# Patient Record
Sex: Female | Born: 1970 | Race: White | Hispanic: No | Marital: Married | Smoking: Never smoker
Health system: Southern US, Community
[De-identification: ages and names within clinical notes are randomized; demographics above are authoritative.]

## PROBLEM LIST (undated history)

## (undated) DIAGNOSIS — D649 Anemia, unspecified: Secondary | ICD-10-CM

## (undated) DIAGNOSIS — Z9889 Other specified postprocedural states: Secondary | ICD-10-CM

## (undated) DIAGNOSIS — R112 Nausea with vomiting, unspecified: Secondary | ICD-10-CM

## (undated) HISTORY — PX: LAPAROSCOPIC TUBAL LIGATION: SUR803

## (undated) HISTORY — PX: WISDOM TOOTH EXTRACTION: SHX21

## (undated) HISTORY — DX: Nausea with vomiting, unspecified: Z98.890

## (undated) HISTORY — DX: Nausea with vomiting, unspecified: R11.2

## (undated) HISTORY — DX: Anemia, unspecified: D64.9

## (undated) HISTORY — PX: CYSTECTOMY: SUR359

---

## 1999-01-29 ENCOUNTER — Other Ambulatory Visit: Admission: RE | Admit: 1999-01-29 | Discharge: 1999-01-29 | Payer: Self-pay | Admitting: Obstetrics and Gynecology

## 2000-08-02 ENCOUNTER — Other Ambulatory Visit: Admission: RE | Admit: 2000-08-02 | Discharge: 2000-08-02 | Payer: Self-pay | Admitting: Obstetrics and Gynecology

## 2001-12-21 ENCOUNTER — Other Ambulatory Visit: Admission: RE | Admit: 2001-12-21 | Discharge: 2001-12-21 | Payer: Self-pay | Admitting: Obstetrics and Gynecology

## 2003-01-02 ENCOUNTER — Other Ambulatory Visit: Admission: RE | Admit: 2003-01-02 | Discharge: 2003-01-02 | Payer: Self-pay | Admitting: Obstetrics and Gynecology

## 2004-03-05 ENCOUNTER — Other Ambulatory Visit: Admission: RE | Admit: 2004-03-05 | Discharge: 2004-03-05 | Payer: Self-pay | Admitting: Obstetrics and Gynecology

## 2005-06-08 ENCOUNTER — Other Ambulatory Visit: Admission: RE | Admit: 2005-06-08 | Discharge: 2005-06-08 | Payer: Self-pay | Admitting: Obstetrics and Gynecology

## 2006-02-25 ENCOUNTER — Ambulatory Visit (HOSPITAL_COMMUNITY): Admission: RE | Admit: 2006-02-25 | Discharge: 2006-02-25 | Payer: Self-pay | Admitting: Obstetrics and Gynecology

## 2011-11-16 ENCOUNTER — Other Ambulatory Visit: Payer: Self-pay | Admitting: Obstetrics and Gynecology

## 2011-11-16 DIAGNOSIS — R928 Other abnormal and inconclusive findings on diagnostic imaging of breast: Secondary | ICD-10-CM

## 2011-11-24 ENCOUNTER — Ambulatory Visit
Admission: RE | Admit: 2011-11-24 | Discharge: 2011-11-24 | Disposition: A | Discharge status: Home / Self Care | Payer: BC Managed Care – PPO | Source: Ambulatory Visit | Attending: Obstetrics and Gynecology | Admitting: Obstetrics and Gynecology

## 2011-11-24 ENCOUNTER — Other Ambulatory Visit: Payer: Self-pay | Admitting: Obstetrics and Gynecology

## 2011-11-24 DIAGNOSIS — R928 Other abnormal and inconclusive findings on diagnostic imaging of breast: Secondary | ICD-10-CM

## 2011-11-24 DIAGNOSIS — N6310 Unspecified lump in the right breast, unspecified quadrant: Secondary | ICD-10-CM

## 2011-11-30 ENCOUNTER — Ambulatory Visit
Admission: RE | Admit: 2011-11-30 | Discharge: 2011-11-30 | Disposition: A | Discharge status: Home / Self Care | Payer: BC Managed Care – PPO | Source: Ambulatory Visit | Attending: Obstetrics and Gynecology | Admitting: Obstetrics and Gynecology

## 2011-11-30 ENCOUNTER — Other Ambulatory Visit: Payer: Self-pay | Admitting: Obstetrics and Gynecology

## 2011-11-30 DIAGNOSIS — N6310 Unspecified lump in the right breast, unspecified quadrant: Secondary | ICD-10-CM

## 2019-08-31 ENCOUNTER — Ambulatory Visit: Payer: BC Managed Care – PPO | Attending: Internal Medicine

## 2019-08-31 DIAGNOSIS — Z23 Encounter for immunization: Secondary | ICD-10-CM | POA: Insufficient documentation

## 2019-08-31 NOTE — Progress Notes (Signed)
   Covid-19 Vaccination Clinic  Name:  Nancy Tate    MRN: [id number] DOB: 04-11-1971  08/31/2019  [name] was observed post Covid-19 immunization for 15 minutes without incident. She was provided with Vaccine Information Sheet and instruction to access the V-Safe system.   [name] was instructed to call 911 with any severe reactions post vaccine: Marland Kitchen Difficulty breathing  . Swelling of face and throat  . A fast heartbeat  . A bad rash all over body  . Dizziness and weakness   Immunizations Administered    Name Date Dose VIS Date Route   Pfizer COVID-19 Vaccine 08/31/2019  6:41 PM 0.3 mL 06/08/2019 Intramuscular   Manufacturer: [place]   Lot: [id number]   [place]: [id number]

## 2019-10-02 ENCOUNTER — Ambulatory Visit: Payer: BC Managed Care – PPO | Attending: Internal Medicine

## 2019-10-02 DIAGNOSIS — Z23 Encounter for immunization: Secondary | ICD-10-CM

## 2019-10-02 NOTE — Progress Notes (Signed)
2  Covid-19 Vaccination Clinic  Name:  Nancy Tate    MRN: [id number] DOB: [birth year]  10/02/2019  Ms. Cafarella was observed post Covid-19 immunization for 15 minutes without incident. She was provided with Vaccine Information Sheet and instruction to access the V-Safe system.   Ms. Dorothy was instructed to call 911 with any severe reactions post vaccine: Marland Kitchen Difficulty breathing  . Swelling of face and throat  . A fast heartbeat  . A bad rash all over body  . Dizziness and weakness   Immunizations Administered    Name Date Dose VIS Date Route   Pfizer COVID-19 Vaccine 10/02/2019  9:57 AM 0.3 mL 06/08/2019 Intramuscular   Manufacturer: [employer], [employer]   Lot: [id number]   [place]: [id number]

## 2020-07-22 ENCOUNTER — Other Ambulatory Visit: Payer: Self-pay | Admitting: Obstetrics and Gynecology

## 2020-07-22 DIAGNOSIS — R928 Other abnormal and inconclusive findings on diagnostic imaging of breast: Secondary | ICD-10-CM

## 2020-08-04 ENCOUNTER — Other Ambulatory Visit: Payer: Self-pay

## 2020-08-04 ENCOUNTER — Ambulatory Visit: Payer: BC Managed Care – PPO

## 2020-08-04 ENCOUNTER — Ambulatory Visit (AMBULATORY_SURGERY_CENTER): Payer: Self-pay

## 2020-08-04 ENCOUNTER — Ambulatory Visit
Admission: RE | Admit: 2020-08-04 | Discharge: 2020-08-04 | Disposition: A | Discharge status: Home / Self Care | Payer: BC Managed Care – PPO | Source: Ambulatory Visit | Attending: Obstetrics and Gynecology | Admitting: Obstetrics and Gynecology

## 2020-08-04 VITALS — Ht 63.0 in | Wt 134.0 lb

## 2020-08-04 DIAGNOSIS — Z1211 Encounter for screening for malignant neoplasm of colon: Secondary | ICD-10-CM

## 2020-08-04 DIAGNOSIS — R928 Other abnormal and inconclusive findings on diagnostic imaging of breast: Secondary | ICD-10-CM

## 2020-08-04 MED ORDER — SUTAB 1479-225-188 MG PO TABS: 1.0000 | ORAL_TABLET | ORAL | 0 refills | Status: DC

## 2020-08-04 NOTE — Progress Notes (Signed)
No egg or soy allergy known to patient  No issues with past sedation with any surgeries or procedures No intubation problems in the past  No FH of Malignant Hyperthermia No diet pills per patient No home 02 use per patient  No blood thinners per patient  Pt denies issues with constipation  No A fib or A flutter  EMMI video via MyChart  COVID 19 guidelines implemented in PV today with Pt and RN  COVID vaccines completed x 2 + booster= Coupon given to pt in PV today , Code to Pharmacy and  NO PA's for preps discussed with pt in PV today  Due to the COVID-19 pandemic we are asking patients to follow certain guidelines.  Pt aware of COVID protocols and [facility] guidelines

## 2020-08-13 ENCOUNTER — Encounter: Payer: Self-pay | Admitting: Gastroenterology

## 2020-08-18 ENCOUNTER — Encounter: Payer: Self-pay | Admitting: Gastroenterology

## 2020-08-18 ENCOUNTER — Ambulatory Visit (AMBULATORY_SURGERY_CENTER): Payer: 59 | Admitting: Gastroenterology

## 2020-08-18 ENCOUNTER — Other Ambulatory Visit: Payer: Self-pay

## 2020-08-18 VITALS — BP 116/61 | HR 57 | Temp 97.1°F | Resp 21 | Ht 63.0 in | Wt 134.0 lb

## 2020-08-18 DIAGNOSIS — D120 Benign neoplasm of cecum: Secondary | ICD-10-CM

## 2020-08-18 DIAGNOSIS — Z1211 Encounter for screening for malignant neoplasm of colon: Secondary | ICD-10-CM

## 2020-08-18 DIAGNOSIS — D122 Benign neoplasm of ascending colon: Secondary | ICD-10-CM

## 2020-08-18 MED ORDER — SODIUM CHLORIDE 0.9 % IV SOLN: 500.0000 mL | Freq: Once | INTRAVENOUS | Status: DC

## 2020-08-18 NOTE — Progress Notes (Signed)
Report to PACU, RN, vss, BBS= Clear.  

## 2020-08-18 NOTE — Progress Notes (Signed)
Called to room to assist during endoscopic procedure.  Patient ID and intended procedure confirmed with present staff. Received instructions for my participation in the procedure from the performing physician.  

## 2020-08-18 NOTE — Progress Notes (Signed)
Pt's states no medical or surgical changes since previsit or office visit.  VS SB

## 2020-08-18 NOTE — Patient Instructions (Signed)
Information on polyps given to you today.  Await pathology results.  Resume previous diet and medications.  YOU HAD AN ENDOSCOPIC PROCEDURE TODAY AT THE [facility]:   Refer to the procedure report that was given to you for any specific questions about what was found during the examination.  If the procedure report does not answer your questions, please call your gastroenterologist to clarify.  If you requested that your care partner not be given the details of your procedure findings, then the procedure report has been included in a sealed envelope for you to review at your convenience later.  YOU SHOULD EXPECT: Some feelings of bloating in the abdomen. Passage of more gas than usual.  Walking can help get rid of the air that was put into your GI tract during the procedure and reduce the bloating. If you had a lower endoscopy (such as a colonoscopy or flexible sigmoidoscopy) you may notice spotting of blood in your stool or on the toilet paper. If you underwent a bowel prep for your procedure, you may not have a normal bowel movement for a few days.  Please Note:  You might notice some irritation and congestion in your nose or some drainage.  This is from the oxygen used during your procedure.  There is no need for concern and it should clear up in a day or so.  SYMPTOMS TO REPORT IMMEDIATELY:   Following lower endoscopy (colonoscopy or flexible sigmoidoscopy):  Excessive amounts of blood in the stool  Significant tenderness or worsening of abdominal pains  Swelling of the abdomen that is new, acute  Fever of 100F or higher   For urgent or emergent issues, a gastroenterologist can be reached at any hour by calling [phone]. Do not use MyChart messaging for urgent concerns.    DIET:  We do recommend a small meal at first, but then you may proceed to your regular diet.  Drink plenty of fluids but you should avoid alcoholic beverages for 24 hours.  ACTIVITY:  You should  plan to take it easy for the rest of today and you should NOT DRIVE or use heavy machinery until tomorrow (because of the sedation medicines used during the test).    FOLLOW UP: Our staff will call the number listed on your records 48-72 hours following your procedure to check on you and address any questions or concerns that you may have regarding the information given to you following your procedure. If we do not reach you, we will leave a message.  We will attempt to reach you two times.  During this call, we will ask if you have developed any symptoms of COVID 19. If you develop any symptoms (ie: fever, flu-like symptoms, shortness of breath, cough etc.) before then, please call [phone].  If you test positive for Covid 19 in the 2 weeks post procedure, please call and report this information to us.    If any biopsies were taken you will be contacted by phone or by letter within the next 1-3 weeks.  Please call us at [phone] if you have not heard about the biopsies in 3 weeks.    SIGNATURES/CONFIDENTIALITY: You and/or your care partner have signed paperwork which will be entered into your electronic medical record.  These signatures attest to the fact that that the information above on your After Visit Summary has been reviewed and is understood.  Full responsibility of the confidentiality of this discharge information lies with you and/or your care-partner. 

## 2020-08-18 NOTE — Op Note (Signed)
[facility] Patient Name: Nancy Tate Procedure Date: 08/18/2020 11:18 AM MRN: [id number] Endoscopist: Thornton Park MD, MD Age: 50 Referring MD:  Date of Birth: 1970-11-23 Gender: Female Account #: [id number] Procedure:                Colonoscopy Indications:              Screening for colorectal malignant neoplasm, This                            is the patient's first colonoscopy                           No known family history of colon cancer or polyps Medicines:                Monitored Anesthesia Care Procedure:                Pre-Anesthesia Assessment:                           - Prior to the procedure, a History and Physical                            was performed, and patient medications and                            allergies were reviewed. The patient's tolerance of                            previous anesthesia was also reviewed. The risks                            and benefits of the procedure and the sedation                            options and risks were discussed with the patient.                            All questions were answered, and informed consent                            was obtained. Prior Anticoagulants: The patient has                            taken no previous anticoagulant or antiplatelet                            agents. ASA Grade Assessment: I - A normal, healthy                            patient. After reviewing the risks and benefits,                            the patient was deemed in satisfactory condition to  undergo the procedure.                           After obtaining informed consent, the colonoscope                            was passed under direct vision. Throughout the                            procedure, the patient's blood pressure, pulse, and                            oxygen saturations were monitored continuously. The                            Olympus PFC-[id number] (#[id number])  Colonoscope was                            introduced through the anus and advanced to the 3                            cm into the ileum. A second forward view of the                            right colon was performed. The colonoscopy was                            performed without difficulty. The patient tolerated                            the procedure well. The quality of the bowel                            preparation was excellent. The terminal ileum,                            ileocecal valve, appendiceal orifice, and rectum                            were photographed. Scope In: 11:29:45 AM Scope Out: 11:42:33 AM Scope Withdrawal Time: 0 hours 9 minutes 4 seconds  Total Procedure Duration: 0 hours 12 minutes 48 seconds  Findings:                 The perianal and digital rectal examinations were                            normal.                           Two flat polyps were found in the ascending colon                            and cecum. The polyps were less than 1 mm in size.  These polyps were removed with a cold snare.                            Resection and retrieval were complete. Estimated                            blood loss was minimal.                           The exam was otherwise without abnormality on                            direct and retroflexion views. Complications:            No immediate complications. Estimated blood loss:                            Minimal. Estimated Blood Loss:     Estimated blood loss was minimal. Impression:               - Two less than 1 mm polyps in the ascending colon                            and in the cecum, removed with a cold snare.                            Resected and retrieved.                           - The examination was otherwise normal on direct                            and retroflexion views. Recommendation:           - Patient has a contact number available for                             emergencies. The signs and symptoms of potential                            delayed complications were discussed with the                            patient. Return to normal activities tomorrow.                            Written discharge instructions were provided to the                            patient.                           - Resume previous diet.                           - Continue present medications.                           -  Await pathology results.                           - Repeat colonoscopy date to be determined after                            pending pathology results are reviewed for                            surveillance.                           - Emerging evidence supports eating a diet of                            fruits, vegetables, grains, calcium, and yogurt                            while reducing red meat and alcohol may reduce the                            risk of colon cancer.                           - Thank you for allowing me to be involved in your                            colon cancer prevention. Thornton Park MD, MD 08/18/2020 11:48:31 AM This report has been signed electronically.

## 2020-08-20 ENCOUNTER — Telehealth: Payer: Self-pay | Admitting: *Deleted

## 2020-08-20 NOTE — Telephone Encounter (Signed)
  Follow up Call-  Call back number 08/18/2020 08/18/2020  Post procedure Call Back phone  # [phone]  Permission to leave phone message Yes Yes  Some recent data might be hidden     Patient questions:  Do you have a fever, pain , or abdominal swelling? No. Pain Score  0 *  Have you tolerated food without any problems? Yes.    Have you been able to return to your normal activities? Yes.    Do you have any questions about your discharge instructions: Diet   No. Medications  No. Follow up visit  No.  Do you have questions or concerns about your Care? No.  Actions: * If pain score is 4 or above: No action needed, pain <4.  1. Have you developed a fever since your procedure? no  2.   Have you had an respiratory symptoms (SOB or cough) since your procedure? no  3.   Have you tested positive for COVID 19 since your procedure no  4.   Have you had any family members/close contacts diagnosed with the COVID 19 since your procedure?  no   If yes to any of these questions please route to Joylene John, RN and Joella Prince, RN

## 2020-08-25 ENCOUNTER — Encounter: Payer: Self-pay | Admitting: Gastroenterology

## 2021-06-10 ENCOUNTER — Other Ambulatory Visit: Payer: Self-pay | Admitting: Obstetrics and Gynecology

## 2021-06-10 DIAGNOSIS — Z1231 Encounter for screening mammogram for malignant neoplasm of breast: Secondary | ICD-10-CM

## 2021-07-17 ENCOUNTER — Ambulatory Visit: Payer: 59

## 2021-07-22 ENCOUNTER — Ambulatory Visit
Admission: RE | Admit: 2021-07-22 | Discharge: 2021-07-22 | Disposition: A | Discharge status: Home / Self Care | Payer: 59 | Source: Ambulatory Visit | Attending: Obstetrics and Gynecology | Admitting: Obstetrics and Gynecology

## 2021-07-22 ENCOUNTER — Encounter: Payer: Self-pay | Admitting: Radiology

## 2021-07-22 DIAGNOSIS — Z1231 Encounter for screening mammogram for malignant neoplasm of breast: Secondary | ICD-10-CM

## 2021-09-21 ENCOUNTER — Ambulatory Visit: Payer: 59 | Admitting: Physician Assistant

## 2021-09-21 ENCOUNTER — Encounter: Payer: Self-pay | Admitting: Physician Assistant

## 2021-09-21 ENCOUNTER — Other Ambulatory Visit (INDEPENDENT_AMBULATORY_CARE_PROVIDER_SITE_OTHER): Payer: 59

## 2021-09-21 VITALS — BP 108/70 | HR 70 | Ht 63.0 in | Wt 141.0 lb

## 2021-09-21 DIAGNOSIS — D509 Iron deficiency anemia, unspecified: Secondary | ICD-10-CM

## 2021-09-21 DIAGNOSIS — R12 Heartburn: Secondary | ICD-10-CM

## 2021-09-21 DIAGNOSIS — R1013 Epigastric pain: Secondary | ICD-10-CM | POA: Diagnosis not present

## 2021-09-21 LAB — CBC WITH DIFFERENTIAL/PLATELET
Basophils Absolute: 0.1 10*3/uL (ref 0.0–0.1)
Basophils Relative: 1.1 % (ref 0.0–3.0)
Eosinophils Absolute: 0.1 10*3/uL (ref 0.0–0.7)
Eosinophils Relative: 1.4 % (ref 0.0–5.0)
HCT: 40.6 % (ref 36.0–46.0)
Hemoglobin: 13.5 g/dL (ref 12.0–15.0)
Lymphocytes Relative: 21.2 % (ref 12.0–46.0)
Lymphs Abs: 1.7 10*3/uL (ref 0.7–4.0)
MCHC: 33.3 g/dL (ref 30.0–36.0)
MCV: 88.3 fl (ref 78.0–100.0)
Monocytes Absolute: 0.6 10*3/uL (ref 0.1–1.0)
Monocytes Relative: 7.5 % (ref 3.0–12.0)
Neutro Abs: 5.6 10*3/uL (ref 1.4–7.7)
Neutrophils Relative %: 68.8 % (ref 43.0–77.0)
Platelets: 275 10*3/uL (ref 150.0–400.0)
RBC: 4.59 Mil/uL (ref 3.87–5.11)
RDW: 17.3 % — ABNORMAL HIGH (ref 11.5–15.5)
WBC: 8.1 10*3/uL (ref 4.0–10.5)

## 2021-09-21 LAB — IBC PANEL
Iron: 71 ug/dL (ref 42–145)
Saturation Ratios: 24.9 % (ref 20.0–50.0)
TIBC: 285.6 ug/dL (ref 250.0–450.0)
Transferrin: 204 mg/dL — ABNORMAL LOW (ref 212.0–360.0)

## 2021-09-21 MED ORDER — OMEPRAZOLE 40 MG PO CPDR: 40.0000 mg | DELAYED_RELEASE_CAPSULE | Freq: Two times a day (BID) | ORAL | 2 refills | Status: DC

## 2021-09-21 MED ORDER — OMEPRAZOLE 20 MG PO CPDR: 20.0000 mg | DELAYED_RELEASE_CAPSULE | Freq: Two times a day (BID) | ORAL | 2 refills | Status: DC

## 2021-09-21 NOTE — Patient Instructions (Addendum)
If you are age 53 or older, your body mass index should be between 23-30. Your Body mass index is 24.98 kg/m?Marland Kitchen If this is out of the aforementioned range listed, please consider follow up with your Primary Care Provider. ? ?If you are age 13 or younger, your body mass index should be between 19-25. Your Body mass index is 24.98 kg/m?Marland Kitchen If this is out of the aformentioned range listed, please consider follow up with your Primary Care Provider.  ? ?________________________________________________________ ? ?The [facility] providers would like to encourage you to use [facility] to communicate with providers for non-urgent requests or questions.  Due to long hold times on the telephone, sending your provider a message by [facility] may be a faster and more efficient way to get a response.  Please allow 48 business hours for a response.  Please remember that this is for non-urgent requests.  ?_______________________________________________________ ? ?Your provider has requested that you go to the basement level for lab work before leaving today. Press "B" on the elevator. The lab is located at the first door on the left as you exit the elevator. ? ?We have sent the following medications to your pharmacy for you to pick up at your convenience: Omeprazole '40mg'$  - increase to twice a day ? ?

## 2021-09-21 NOTE — Progress Notes (Signed)
? ?Chief Complaint: Iron deficiency anemia ? ?HPI: ?   Nancy Tate is a 51 year old female, known to Dr. Tarri Glenn, with a past medical history as listed below, who was referred to me by Tisovec, Fransico Him, MD for a complaint of iron deficiency anemia. ?   08/18/2020 colonoscopy with 2 less than 1 mm polyps in the ascending colon and cecum.  Pathology showed tubular adenomas and repeat recommended in 7 years. ?   Today patient reports that her hemoglobin was initially 9.6 in December and she had a ferritin low at 6.  She was started on oral iron tabs and Prilosec as she had also started with some epigastric pain.  Her hemoglobin was then rechecked in January and was up to 11 at her OB/GYN's office.  Her PCP feared that her ferritin was so low that she would need iron infusions and she had one in February.  She is currently not on any oral iron supplementation.  Tells me she is still on Omeprazole 20 mg daily and has constant epigastric pain made worse when she eats as well as some heartburn but no reflux symptoms.  Denies seeing black or tarry stool.  Prior to December patient was on Meloxicam and other NSAIDs which were discontinued then for fear of upper GI ulcer/bleed. ?   Denies fever, chills or weight loss. ? ?Past Medical History:  ?Diagnosis Date  ? Anemia   ? not on meds at this time (08/04/2020)  ? PONV (postoperative nausea and vomiting)   ? ? ?Past Surgical History:  ?Procedure Laterality Date  ? BREAST BIOPSY Right   ? 2013 (-)  ? CYSTECTOMY    ? uterine cyst  ? LAPAROSCOPIC TUBAL LIGATION    ? WISDOM TOOTH EXTRACTION    ? ? ?Current Outpatient Medications  ?Medication Sig Dispense Refill  ? omeprazole (PRILOSEC) 20 MG capsule 1 capsule 30 minutes before morning meal    ? ?No current facility-administered medications for this visit.  ? ? ?Allergies as of 09/21/2021 - Review Complete 09/21/2021  ?Allergen Reaction Noted  ? Codeine  11/30/2011  ? Neosporin [neomycin-bacitracin zn-polymyx]  11/30/2011   ? ? ?Family History  ?Problem Relation Age of Onset  ? Colon polyps Mother 6  ? Colon cancer Neg Hx   ? Esophageal cancer Neg Hx   ? Rectal cancer Neg Hx   ? Stomach cancer Neg Hx   ? ? ?Social History  ? ?Socioeconomic History  ? Marital status: Married  ?  Spouse name: Not on file  ? Number of children: 2  ? Years of education: Not on file  ? Highest education level: Not on file  ?Occupational History  ? Not on file  ?Tobacco Use  ? Smoking status: Never  ? Smokeless tobacco: Never  ?Vaping Use  ? Vaping Use: Never used  ?Substance and Sexual Activity  ? Alcohol use: Never  ? Drug use: Never  ? Sexual activity: Not on file  ?Other Topics Concern  ? Not on file  ?Social History Narrative  ? Not on file  ? ?Social Determinants of Health  ? ?Financial Resource Strain: Not on file  ?Food Insecurity: Not on file  ?Transportation Needs: Not on file  ?Physical Activity: Not on file  ?Stress: Not on file  ?Social Connections: Not on file  ?Intimate Partner Violence: Not on file  ? ? ?Review of Systems:    ?Constitutional: No weight loss, fever or chills ?Cardiovascular: No chest pain ?Respiratory: No SOB  ?  Gastrointestinal: See HPI and otherwise negative ? ? Physical Exam:  ?Vital signs: ?BP 108/70   Pulse 70   Ht '5\' 3"'$  (1.6 m)   Wt 141 lb (64 kg)   SpO2 99%   BMI 24.98 kg/m?   ? ?Constitutional:   Pleasant Caucasian female appears to be in NAD, Well developed, Well nourished, alert and cooperative ?Respiratory: Respirations even and unlabored. Lungs clear to auscultation bilaterally.   No wheezes, crackles, or rhonchi.  ?Cardiovascular: Normal S1, S2. No MRG. Regular rate and rhythm. No peripheral edema, cyanosis or pallor.  ?Gastrointestinal:  Soft, nondistended, moderate epigastric TTP with involuntary guarding,. Normal bowel sounds. No appreciable masses or hepatomegaly. ?Rectal:  Not performed.  ?Psychiatric:Demonstrates good judgement and reason without abnormal affect or behaviors. ? ?See HPI for recent  labs. ? ?Assessment: ?1.  IDA: Recent colonoscopy 08/18/2020 for screening with 2 small polyps found to be tubular adenomas, in December patient had a ferritin low at 6 and hemoglobin 9.6 at her PCPs office and started oral iron supplementation, in January hemoglobin 11, underwent iron infusions in February, has continued with epigastric pain regardless of Omeprazole 20 mg daily, previous history of NSAID use; consider upper GI bleed most likely from possible PUD/gastritis ?2.  Epigastric pain and heartburn: With above ? ?Plan: ?1.  Increased Omeprazole to 40 mg twice daily, 30-60 minutes before breakfast and dinner prescribed #60 with 2 refills ?2.  Told patient to continue to avoid NSAIDs ?3.  Recheck CBC and iron studies today ?4.  Scheduled patient for diagnostic EGD in the [facility] with Dr. Tarri Glenn.  Did provide the patient a detailed list of risks for the procedure and she agrees to proceed. Patient is appropriate for endoscopic procedure(s) in the ambulatory ([facility]) setting.  ?5.  Patient follow in clinic per recommendations from Dr. Tarri Glenn after time of procedure. ? ?Ellouise Newer, PA-C ?[facility] ?09/21/2021, 1:59 PM ? ?Cc: Tisovec, Fransico Him, MD  ?

## 2021-09-21 NOTE — Progress Notes (Signed)
Reviewed and agree with management plans. Please place on cancellation list in the event that an earlier procedure becomes available.  ? ?Alvira Hecht L. Tarri Glenn, MD, MPH  ?

## 2021-09-22 LAB — FERRITIN: Ferritin: 184.6 ng/mL (ref 10.0–291.0)

## 2021-09-22 NOTE — Progress Notes (Signed)
Pt has been successfully added to wait list per Dr. Tarri Glenn request. ?

## 2021-09-23 ENCOUNTER — Other Ambulatory Visit: Payer: Self-pay

## 2021-09-23 DIAGNOSIS — D509 Iron deficiency anemia, unspecified: Secondary | ICD-10-CM

## 2021-10-26 ENCOUNTER — Encounter: Payer: 59 | Admitting: Gastroenterology

## 2021-11-16 ENCOUNTER — Ambulatory Visit (AMBULATORY_SURGERY_CENTER): Payer: 59 | Admitting: Gastroenterology

## 2021-11-16 ENCOUNTER — Encounter: Payer: Self-pay | Admitting: Gastroenterology

## 2021-11-16 VITALS — BP 112/65 | HR 63 | Temp 98.9°F | Resp 14 | Ht 63.0 in | Wt 141.0 lb

## 2021-11-16 DIAGNOSIS — D509 Iron deficiency anemia, unspecified: Secondary | ICD-10-CM | POA: Diagnosis present

## 2021-11-16 DIAGNOSIS — K259 Gastric ulcer, unspecified as acute or chronic, without hemorrhage or perforation: Secondary | ICD-10-CM

## 2021-11-16 DIAGNOSIS — R1013 Epigastric pain: Secondary | ICD-10-CM

## 2021-11-16 DIAGNOSIS — K3189 Other diseases of stomach and duodenum: Secondary | ICD-10-CM | POA: Diagnosis not present

## 2021-11-16 MED ORDER — SODIUM CHLORIDE 0.9 % IV SOLN: 500.0000 mL | Freq: Once | INTRAVENOUS | Status: DC

## 2021-11-16 NOTE — Progress Notes (Signed)
Called to room to assist during endoscopic procedure.  Patient ID and intended procedure confirmed with present staff. Received instructions for my participation in the procedure from the performing physician.  

## 2021-11-16 NOTE — Progress Notes (Signed)
Pt's states no medical or surgical changes since previsit or office visit. VS assessed by C.W 

## 2021-11-16 NOTE — Progress Notes (Signed)
   Referring Provider: Haywood Pao, MD Primary Care Physician:  Haywood Pao, MD  Indication for Procedure: Iron deficiency anemia, epigastric pain, heartburn   IMPRESSION:  Need for colon cancer screening Appropriate candidate for monitored anesthesia care  PLAN: Colonoscopy in the [facility] today   HPI: Nancy Tate is a 51 y.o. female presents for endoscopic evaluation of iron deficiency anemia, epigastric pain, and heartburn.  Recent colonoscopy 08/18/2020 for screening with 2 small polyps found to be tubular adenomas.  In December patient had a ferritin low at 6 and hemoglobin 9.6 at her PCPs office and started oral iron supplementation, in January hemoglobin 11, underwent iron infusions in February, has continued with epigastric pain regardless of Omeprazole 20 mg daily, previous history of NSAID use.    Past Medical History:  Diagnosis Date   Anemia    not on meds at this time (08/04/2020)   PONV (postoperative nausea and vomiting)     Past Surgical History:  Procedure Laterality Date   BREAST BIOPSY Right    2013 (-)   CYSTECTOMY     uterine cyst   LAPAROSCOPIC TUBAL LIGATION     WISDOM TOOTH EXTRACTION      Current Outpatient Medications  Medication Sig Dispense Refill   omeprazole (PRILOSEC) 40 MG capsule Take 1 capsule (40 mg total) by mouth in the morning and at bedtime. 60 capsule 2   Current Facility-Administered Medications  Medication Dose Route Frequency Provider Last Rate Last Admin   0.9 %  sodium chloride infusion  500 mL Intravenous Once Thornton Park, MD        Allergies as of 11/16/2021 - Review Complete 11/16/2021  Allergen Reaction Noted   Codeine  11/30/2011   Neosporin [neomycin-bacitracin zn-polymyx]  11/30/2011    Family History  Problem Relation Age of Onset   Colon polyps Mother 35   Colon cancer Neg Hx    Esophageal cancer Neg Hx    Rectal cancer Neg Hx    Stomach cancer Neg Hx      Physical Exam: General:    Alert,  well-nourished, pleasant and cooperative in NAD Head:  Normocephalic and atraumatic. Eyes:  Sclera clear, no icterus.   Conjunctiva pink. Mouth:  No deformity or lesions.   Neck:  Supple; no masses or thyromegaly. Lungs:  Clear throughout to auscultation.   No wheezes. Heart:  Regular rate and rhythm; no murmurs. Abdomen:  Soft, non-tender, nondistended, normal bowel sounds, no rebound or guarding.  Msk:  Symmetrical. No boney deformities LAD: No inguinal or umbilical LAD Extremities:  No clubbing or edema. Neurologic:  Alert and  oriented x4;  grossly nonfocal Skin:  No obvious rash or bruise. Psych:  Alert and cooperative. Normal mood and affect.     Studies/Results: No results found.    Fawna Cranmer L. Tarri Glenn, MD, MPH 11/16/2021, 3:39 PM

## 2021-11-16 NOTE — Patient Instructions (Signed)
Continue anti-acid medications as discussed with Dr. Tarri Glenn Await pathology results No NSAID's (ie. Asprin, Naproxen, Ibuprofen, or other non-steroidal anti-inflammatory medications)   YOU HAD AN ENDOSCOPIC PROCEDURE TODAY AT [facility]:   Refer to the procedure report that was given to you for any specific questions about what was found during the examination.  If the procedure report does not answer your questions, please call your gastroenterologist to clarify.  If you requested that your care partner not be given the details of your procedure findings, then the procedure report has been included in a sealed envelope for you to review at your convenience later.  YOU SHOULD EXPECT: Some feelings of bloating in the abdomen. Passage of more gas than usual.  Walking can help get rid of the air that was put into your GI tract during the procedure and reduce the bloating. If you had a lower endoscopy (such as a colonoscopy or flexible sigmoidoscopy) you may notice spotting of blood in your stool or on the toilet paper. If you underwent a bowel prep for your procedure, you may not have a normal bowel movement for a few days.  Please Note:  You might notice some irritation and congestion in your nose or some drainage.  This is from the oxygen used during your procedure.  There is no need for concern and it should clear up in a day or so.  SYMPTOMS TO REPORT IMMEDIATELY:  Following upper endoscopy (EGD)  Vomiting of blood or coffee ground material  New chest pain or pain under the shoulder blades  Painful or persistently difficult swallowing  New shortness of breath  Fever of 100F or higher  Black, tarry-looking stools  For urgent or emergent issues, a gastroenterologist can be reached at any hour by calling [phone]. Do not use MyChart messaging for urgent concerns.    DIET:  We do recommend a small meal at first, but then you may proceed to your regular diet.  Drink  plenty of fluids but you should avoid alcoholic beverages for 24 hours.  ACTIVITY:  You should plan to take it easy for the rest of today and you should NOT DRIVE or use heavy machinery until tomorrow (because of the sedation medicines used during the test).    FOLLOW UP: Our staff will call the number listed on your records 48-72 hours following your procedure to check on you and address any questions or concerns that you may have regarding the information given to you following your procedure. If we do not reach you, we will leave a message.  We will attempt to reach you two times.  During this call, we will ask if you have developed any symptoms of COVID 19. If you develop any symptoms (ie: fever, flu-like symptoms, shortness of breath, cough etc.) before then, please call [phone].  If you test positive for Covid 19 in the 2 weeks post procedure, please call and report this information to [place].    If any biopsies were taken you will be contacted by phone or by letter within the next 1-3 weeks.  Please call us at [phone] if you have not heard about the biopsies in 3 weeks.    SIGNATURES/CONFIDENTIALITY: You and/or your care partner have signed paperwork which will be entered into your electronic medical record.  These signatures attest to the fact that that the information above on your After Visit Summary has been reviewed and is understood.  Full responsibility of the confidentiality of  this discharge information lies with you and/or your care-partner.

## 2021-11-16 NOTE — Op Note (Signed)
[place] Patient Name: Nancy Tate Procedure Date: 11/16/2021 3:38 PM MRN: [id number] Endoscopist: Thornton Park MD, MD Age: 51 Referring MD:  Date of Birth: Dec 05, 1970 Gender: Female Account #: [id number] Procedure:                Upper GI endoscopy Indications:              Abdominal pain, Unexplained iron deficiency anemia Medicines:                Monitored Anesthesia Care Procedure:                Pre-Anesthesia Assessment:                           - Prior to the procedure, a History and Physical                            was performed, and patient medications and                            allergies were reviewed. The patient's tolerance of                            previous anesthesia was also reviewed. The risks                            and benefits of the procedure and the sedation                            options and risks were discussed with the patient.                            All questions were answered, and informed consent                            was obtained. Prior Anticoagulants: The patient has                            taken no previous anticoagulant or antiplatelet                            agents. ASA Grade Assessment: II - A patient with                            mild systemic disease. After reviewing the risks                            and benefits, the patient was deemed in                            satisfactory condition to undergo the procedure.                           After obtaining informed consent, the endoscope was  passed under direct vision. Throughout the                            procedure, the patient's blood pressure, pulse, and                            oxygen saturations were monitored continuously. The                            Endoscope was introduced through the mouth, and                            advanced to the third part of duodenum. The upper                            GI  endoscopy was accomplished without difficulty.                            The patient tolerated the procedure well. Scope In: Scope Out: Findings:                 The esophagus was normal.                           Diffuse severe superficial mucosal changes                            characterized by ulceration were found throughout                            the gastric body. Biopsies were taken from the                            areas of ulceration as well as from the more normal                            appearing antrum, body, and fundus with a cold                            forceps for histology. Estimated blood loss was                            minimal.                           The examined duodenum was normal. Complications:            No immediate complications. Estimated Blood Loss:     Estimated blood loss was minimal. Impression:               - Normal esophagus.                           - Ulcerated mucosa in the gastric body. Biopsied.                           -  Normal examined duodenum. Recommendation:           - Patient has a contact number available for                            emergencies. The signs and symptoms of potential                            delayed complications were discussed with the                            patient. Return to normal activities tomorrow.                            Written discharge instructions were provided to the                            patient.                           - Resume previous diet.                           - Continue present medications including                            pantoprazole 40 mg BID.                           - Await pathology results.                           - No aspirin, ibuprofen, naproxen, or other                            non-steroidal anti-inflammatory drugs. Thornton Park MD, MD 11/16/2021 4:03:35 PM This report has been signed electronically.

## 2021-11-17 ENCOUNTER — Telehealth: Payer: Self-pay

## 2021-11-17 NOTE — Telephone Encounter (Signed)
  Follow up Call-     11/16/2021    3:23 PM 08/18/2020   10:52 AM 08/18/2020   10:47 AM  Call back number  Post procedure Call Back phone  # [phone]  Permission to leave phone message Yes Yes Yes     Patient questions:  Do you have a fever, pain , or abdominal swelling? No. Pain Score  0 *  Have you tolerated food without any problems? Yes.    Have you been able to return to your normal activities? Yes.    Do you have any questions about your discharge instructions: Diet   No. Medications  No. Follow up visit  No.  Do you have questions or concerns about your Care? No.  Actions: * If pain score is 4 or above: No action needed, pain <4.

## 2021-11-17 NOTE — Telephone Encounter (Signed)
No answer, left message to call back later today, B.Grisell Bissette RN. 

## 2021-12-08 ENCOUNTER — Telehealth: Payer: Self-pay | Admitting: *Deleted

## 2021-12-08 NOTE — Telephone Encounter (Signed)
Contacted patient with Dr Payton Emerald recommendations.  F/u EGD scheduled for 02/01/22 @ 1:30 .  PV instructions sent through [facility]. Patient was advised to call office if she has any changes to health or medications before this follow up.

## 2021-12-18 ENCOUNTER — Other Ambulatory Visit: Payer: Self-pay | Admitting: Physician Assistant

## 2021-12-24 ENCOUNTER — Telehealth: Payer: Self-pay

## 2021-12-24 ENCOUNTER — Other Ambulatory Visit (INDEPENDENT_AMBULATORY_CARE_PROVIDER_SITE_OTHER): Payer: 59

## 2021-12-24 DIAGNOSIS — D509 Iron deficiency anemia, unspecified: Secondary | ICD-10-CM | POA: Diagnosis not present

## 2021-12-24 LAB — CBC WITH DIFFERENTIAL/PLATELET
Basophils Absolute: 0.1 10*3/uL (ref 0.0–0.1)
Basophils Relative: 1.2 % (ref 0.0–3.0)
Eosinophils Absolute: 0.1 10*3/uL (ref 0.0–0.7)
Eosinophils Relative: 1.4 % (ref 0.0–5.0)
HCT: 42 % (ref 36.0–46.0)
Hemoglobin: 13.9 g/dL (ref 12.0–15.0)
Lymphocytes Relative: 20.3 % (ref 12.0–46.0)
Lymphs Abs: 1.7 10*3/uL (ref 0.7–4.0)
MCHC: 33.2 g/dL (ref 30.0–36.0)
MCV: 91.1 fl (ref 78.0–100.0)
Monocytes Absolute: 0.7 10*3/uL (ref 0.1–1.0)
Monocytes Relative: 8.6 % (ref 3.0–12.0)
Neutro Abs: 5.6 10*3/uL (ref 1.4–7.7)
Neutrophils Relative %: 68.5 % (ref 43.0–77.0)
Platelets: 310 10*3/uL (ref 150.0–400.0)
RBC: 4.61 Mil/uL (ref 3.87–5.11)
RDW: 13.5 % (ref 11.5–15.5)
WBC: 8.2 10*3/uL (ref 4.0–10.5)

## 2021-12-24 LAB — IBC + FERRITIN
Ferritin: 144.6 ng/mL (ref 10.0–291.0)
Iron: 115 ug/dL (ref 42–145)
Saturation Ratios: 35.4 % (ref 20.0–50.0)
TIBC: 324.8 ug/dL (ref 250.0–450.0)
Transferrin: 232 mg/dL (ref 212.0–360.0)

## 2021-12-24 NOTE — Telephone Encounter (Signed)
-----   Message from Marice Potter, RN sent at 09/23/2021  4:06 PM EDT ----- Regarding: labs Pt needs repeat CBC and Iron panel. Orders are in epic

## 2021-12-24 NOTE — Telephone Encounter (Signed)
Spoke with patient to remind her that she is due for repeat labs at this time. No appointment is necessary. Patient is aware that she can stop by the lab in the basement at her convenience between 7:30 AM - 5 PM, Monday through Friday. Patient verbalized understanding and had no concerns at the end of the call.   

## 2022-01-14 ENCOUNTER — Telehealth: Payer: Self-pay | Admitting: Gastroenterology

## 2022-01-14 NOTE — Telephone Encounter (Signed)
Inbound call from patient requesting to speak with someone in regards to upcoming EDG she has scheduled. Patient states she wants to know why she has to have another procedure. Advised patient someone will give her a call back.  Thank you

## 2022-01-14 NOTE — Telephone Encounter (Signed)
Patient is currently scheduled for an EGD on 02/01/22. She would like to know if the EGD is absolutely necessary (given expense) since her iron levels have improved from recent blood work collected on 12/24/21. If so, she is agreeable with moving forward with the procedure, however if it is something that she can wait on & just recheck labs again in a month then she would like to know if that is an option. Will route to MD for further recommendations.

## 2022-01-15 NOTE — Telephone Encounter (Signed)
Spoke with patient regarding MD recommendations. Pt plans to proceed with EGD.

## 2022-02-01 ENCOUNTER — Telehealth: Payer: Self-pay

## 2022-02-01 ENCOUNTER — Encounter: Payer: Self-pay | Admitting: Gastroenterology

## 2022-02-01 ENCOUNTER — Ambulatory Visit (AMBULATORY_SURGERY_CENTER): Payer: 59 | Admitting: Gastroenterology

## 2022-02-01 VITALS — BP 113/65 | HR 63 | Temp 97.0°F | Resp 14 | Ht 63.0 in | Wt 141.0 lb

## 2022-02-01 DIAGNOSIS — D509 Iron deficiency anemia, unspecified: Secondary | ICD-10-CM | POA: Diagnosis not present

## 2022-02-01 DIAGNOSIS — K2100 Gastro-esophageal reflux disease with esophagitis, without bleeding: Secondary | ICD-10-CM | POA: Diagnosis not present

## 2022-02-01 DIAGNOSIS — R1013 Epigastric pain: Secondary | ICD-10-CM

## 2022-02-01 DIAGNOSIS — K3189 Other diseases of stomach and duodenum: Secondary | ICD-10-CM | POA: Diagnosis not present

## 2022-02-01 DIAGNOSIS — K219 Gastro-esophageal reflux disease without esophagitis: Secondary | ICD-10-CM

## 2022-02-01 MED ORDER — SODIUM CHLORIDE 0.9 % IV SOLN: 500.0000 mL | Freq: Once | INTRAVENOUS | Status: DC

## 2022-02-01 NOTE — Telephone Encounter (Signed)
Follow up scheduled for 03/03/22 at 8:30 am with Anderson Malta, [place]. Pt notified via mychart.

## 2022-02-01 NOTE — Progress Notes (Signed)
   Referring Provider: Haywood Pao, MD Primary Care Physician:  Haywood Pao, MD  Indication for Procedure:  Assess mucosal healing of gastric ulcers   IMPRESSION:  Gastric ulceration on EGD 11/16/21 Appropriate candidate for monitored anesthesia care  PLAN: EGD to assess for mucosal healing in the [facility] today   HPI: Nancy Tate is a 51 y.o. female presents for EGD to follow-up on gastric ulcerations.  Endoscopic evaluation for iron deficiency anemia 11/16/2021 revealed diffusely ulcerated gastric mucosa.  Biopsies showed reactive gastropathy with erosion.  Biopsies were negative for H. pylori.  Repeat EGD recommended in 8 to 10 weeks to assess mucosal healing.  Labs 12/24/21 showed normal iron, ferritin, and CBC.      Past Medical History:  Diagnosis Date   Anemia    not on meds at this time (08/04/2020)   PONV (postoperative nausea and vomiting)     Past Surgical History:  Procedure Laterality Date   BREAST BIOPSY Right    2013 (-)   CYSTECTOMY     uterine cyst   LAPAROSCOPIC TUBAL LIGATION     WISDOM TOOTH EXTRACTION      Current Outpatient Medications  Medication Sig Dispense Refill   omeprazole (PRILOSEC) 40 MG capsule TAKE ONE CAPSULE BY MOUTH EVERY MORNING AND 1 CAPSULE AT BEDTIME 180 capsule 0   Current Facility-Administered Medications  Medication Dose Route Frequency Provider Last Rate Last Admin   0.9 %  sodium chloride infusion  500 mL Intravenous Once Thornton Park, MD        Allergies as of 02/01/2022 - Review Complete 02/01/2022  Allergen Reaction Noted   Codeine  11/30/2011   Bacitra-neomycin-polymyxin-hc Rash 02/01/2022   Neomycin-bacitracin zn-polymyx Rash 11/30/2011    Family History  Problem Relation Age of Onset   Colon polyps Mother 28   Colon cancer Neg Hx    Esophageal cancer Neg Hx    Rectal cancer Neg Hx    Stomach cancer Neg Hx      Physical Exam: General:   Alert,  well-nourished, pleasant and cooperative in  NAD Head:  Normocephalic and atraumatic. Eyes:  Sclera clear, no icterus.   Conjunctiva pink. Mouth:  No deformity or lesions.   Neck:  Supple; no masses or thyromegaly. Lungs:  Clear throughout to auscultation.   No wheezes. Heart:  Regular rate and rhythm; no murmurs. Abdomen:  Soft, non-tender, nondistended, normal bowel sounds, no rebound or guarding.  Msk:  Symmetrical. No boney deformities LAD: No inguinal or umbilical LAD Extremities:  No clubbing or edema. Neurologic:  Alert and  oriented x4;  grossly nonfocal Skin:  No obvious rash or bruise. Psych:  Alert and cooperative. Normal mood and affect.     Studies/Results: No results found.    Ashlye Oviedo L. Tarri Glenn, MD, MPH 02/01/2022, 1:24 PM

## 2022-02-01 NOTE — Telephone Encounter (Signed)
-----   Message from Thornton Park, MD sent at 02/01/2022  1:46 PM EDT ----- Please arrange office follow-up with any APP.  Thanks.  KLB

## 2022-02-01 NOTE — Progress Notes (Signed)
Called to room to assist during endoscopic procedure.  Patient ID and intended procedure confirmed with present staff. Received instructions for my participation in the procedure from the performing physician.  

## 2022-02-01 NOTE — Progress Notes (Signed)
Pt in recovery with monitors in place, VSS. Report given to receiving RN. Bite guard was placed with pt awake to ensure comfort. No dental or soft tissue damage noted. 

## 2022-02-01 NOTE — Progress Notes (Signed)
Pt's states no medical or surgical changes since previsit or office visit. 

## 2022-02-01 NOTE — Patient Instructions (Signed)
Await pathology results from biopsies taken today.  Resume previous diet and continue present medications. Follow-up appointment in office with Dr. Tarri Glenn - her nurse will arrange this  No NSAID's (aspirin, naproxen, ibuprofen, or any other non-steroidal anti-inflammatory medications)   YOU HAD AN ENDOSCOPIC PROCEDURE TODAY AT [facility]:   Refer to the procedure report that was given to you for any specific questions about what was found during the examination.  If the procedure report does not answer your questions, please call your gastroenterologist to clarify.  If you requested that your care partner not be given the details of your procedure findings, then the procedure report has been included in a sealed envelope for you to review at your convenience later.  YOU SHOULD EXPECT: Some feelings of bloating in the abdomen. Passage of more gas than usual.  Walking can help get rid of the air that was put into your GI tract during the procedure and reduce the bloating. If you had a lower endoscopy (such as a colonoscopy or flexible sigmoidoscopy) you may notice spotting of blood in your stool or on the toilet paper. If you underwent a bowel prep for your procedure, you may not have a normal bowel movement for a few days.  Please Note:  You might notice some irritation and congestion in your nose or some drainage.  This is from the oxygen used during your procedure.  There is no need for concern and it should clear up in a day or so.  SYMPTOMS TO REPORT IMMEDIATELY:  Following upper endoscopy (EGD)  Vomiting of blood or coffee ground material  New chest pain or pain under the shoulder blades  Painful or persistently difficult swallowing  New shortness of breath  Fever of 100F or higher  Black, tarry-looking stools  For urgent or emergent issues, a gastroenterologist can be reached at any hour by calling [phone]. Do not use MyChart messaging for urgent concerns.     DIET:  We do recommend a small meal at first, but then you may proceed to your regular diet.  Drink plenty of fluids but you should avoid alcoholic beverages for 24 hours.  ACTIVITY:  You should plan to take it easy for the rest of today and you should NOT DRIVE or use heavy machinery until tomorrow (because of the sedation medicines used during the test).    FOLLOW UP: Our staff will call the number listed on your records the next business day following your procedure.  We will call around 7:15- 8:00 am to check on you and address any questions or concerns that you may have regarding the information given to you following your procedure. If we do not reach you, we will leave a message.  If you develop any symptoms (ie: fever, flu-like symptoms, shortness of breath, cough etc.) before then, please call [phone].  If you test positive for Covid 19 in the 2 weeks post procedure, please call and report this information to [place].    If any biopsies were taken you will be contacted by phone or by letter within the next 1-3 weeks.  Please call us at [phone] if you have not heard about the biopsies in 3 weeks.    SIGNATURES/CONFIDENTIALITY: You and/or your care partner have signed paperwork which will be entered into your electronic medical record.  These signatures attest to the fact that that the information above on your After Visit Summary has been reviewed and is understood.  Full responsibility  of the confidentiality of this discharge information lies with you and/or your care-partner.

## 2022-02-01 NOTE — Op Note (Signed)
[place] Patient Name: Nancy Tate Procedure Date: 02/01/2022 1:25 PM MRN: [id number] Endoscopist: Thornton Park MD, MD Age: 51 Referring MD:  Date of Birth: 06-Apr-1971 Gender: Female Account #: [id number] Procedure:                Upper GI endoscopy Indications:              Follow-up of gastric ulcers seen on EGD 10/2021,                            overall clinical improvement but still with some                            abdominal pain Medicines:                Monitored Anesthesia Care Procedure:                Pre-Anesthesia Assessment:                           - Prior to the procedure, a History and Physical                            was performed, and patient medications and                            allergies were reviewed. The patient's tolerance of                            previous anesthesia was also reviewed. The risks                            and benefits of the procedure and the sedation                            options and risks were discussed with the patient.                            All questions were answered, and informed consent                            was obtained. Prior Anticoagulants: The patient has                            taken no previous anticoagulant or antiplatelet                            agents. ASA Grade Assessment: II - A patient with                            mild systemic disease. After reviewing the risks                            and benefits, the patient was deemed in  satisfactory condition to undergo the procedure.                           After obtaining informed consent, the endoscope was                            passed under direct vision. Throughout the                            procedure, the patient's blood pressure, pulse, and                            oxygen saturations were monitored continuously. The                            Endoscope was introduced through the  mouth, and                            advanced to the third part of duodenum. The upper                            GI endoscopy was accomplished without difficulty.                            The patient tolerated the procedure well. Scope In: Scope Out: Findings:                 The Z-line was slightly irregular and was found 36                            cm from the incisors. Biopsies were taken with a                            cold forceps for histology. Estimated blood loss                            was minimal.                           Striped mildly erythematous mucosa without bleeding                            was found in the gastric antrum. Biopsies were                            taken from the antrum, body, and fundus with a cold                            forceps for histology. Estimated blood loss was                            minimal.                           A small hiatal  hernia was present.                           The examined duodenum was normal. Complications:            No immediate complications. Estimated Blood Loss:     Estimated blood loss was minimal. Impression:               - Z-line irregular, 36 cm from the incisors.                            Biopsied.                           - Previously identified gastric ulcers have healed.                           - Erythematous mucosa in the antrum. Biopsied.                           - Small hiatal hernia.                           - Normal examined duodenum. Recommendation:           - Patient has a contact number available for                            emergencies. The signs and symptoms of potential                            delayed complications were discussed with the                            patient. Return to normal activities tomorrow.                            Written discharge instructions were provided to the                            patient.                           - Resume previous  diet.                           - Continue present medications.                           - Await pathology results.                           - No aspirin, ibuprofen, naproxen, or other                            non-steroidal anti-inflammatory drugs.                           -  I will arrange office follow-up. Thornton Park MD, MD 02/01/2022 1:52:21 PM This report has been signed electronically.

## 2022-02-02 ENCOUNTER — Telehealth: Payer: Self-pay

## 2022-02-02 NOTE — Telephone Encounter (Signed)
  Follow up Call-     02/01/2022   12:38 PM 11/16/2021    3:23 PM 08/18/2020   10:52 AM 08/18/2020   10:47 AM  Call back number  Post procedure Call Back phone  # [phone]  Permission to leave phone message Yes Yes Yes Yes     Patient questions:  Do you have a fever, pain , or abdominal swelling? No. Pain Score  0 *  Have you tolerated food without any problems? Yes.    Have you been able to return to your normal activities? Yes.    Do you have any questions about your discharge instructions: Diet   No. Medications  No. Follow up visit  No.  Do you have questions or concerns about your Care? No.  Actions: * If pain score is 4 or above: No action needed, pain <4.

## 2022-02-23 ENCOUNTER — Encounter: Payer: Self-pay | Admitting: Gastroenterology

## 2022-03-03 ENCOUNTER — Encounter: Payer: Self-pay | Admitting: Physician Assistant

## 2022-03-03 ENCOUNTER — Ambulatory Visit: Payer: 59 | Admitting: Physician Assistant

## 2022-03-03 VITALS — BP 98/62 | HR 80 | Ht 63.5 in | Wt 144.2 lb

## 2022-03-03 DIAGNOSIS — D509 Iron deficiency anemia, unspecified: Secondary | ICD-10-CM

## 2022-03-03 DIAGNOSIS — K219 Gastro-esophageal reflux disease without esophagitis: Secondary | ICD-10-CM | POA: Diagnosis not present

## 2022-03-03 MED ORDER — OMEPRAZOLE 40 MG PO CPDR: 40.0000 mg | DELAYED_RELEASE_CAPSULE | Freq: Every day | ORAL | 2 refills | Status: DC

## 2022-03-03 NOTE — Patient Instructions (Addendum)
If you are age 53 or older, your body mass index should be between 23-30. Your Body mass index is 25.15 kg/m. If this is out of the aforementioned range listed, please consider follow up with your Primary Care Provider.  If you are age 66 or younger, your body mass index should be between 19-25. Your Body mass index is 25.15 kg/m. If this is out of the aformentioned range listed, please consider follow up with your Primary Care Provider.   ________________________________________________________  The [facility] providers would like to encourage you to use [facility] to communicate with providers for non-urgent requests or questions.  Due to long hold times on the telephone, sending your provider a message by [facility] may be a faster and more efficient way to get a response.  Please allow 48 business hours for a response.  Please remember that this is for non-urgent requests.  _______________________________________________________   We have sent the following medications to your pharmacy for you to pick up at your convenience: Omeprazole 40 mg once daily  Start by using twice a day for once a day then go down to once daily.  Follow up in 6 months.  It was a pleasure to see you today!  Thank you for trusting me with your gastrointestinal care!

## 2022-03-03 NOTE — Progress Notes (Signed)
Chief Complaint: Follow-up IDA  HPI:    Mrs. Naab is a 51 year old female with a past medical history as listed below, signed to Dr. Tarri Glenn at last visit, who returns to clinic today for follow-up of IDA.    08/18/2020 colonoscopy with 2 less than 1 mm polyps in the ascending colon and cecum.  Pathology showed tubular adenomas and repeat recommended in 7 years.    09/21/2021 patient seen in clinic by me.  At that time initially her hemoglobin had been 9.6 in December and she had a ferritin low at 6.  She was started on oral iron tabs and Prilosec and notes and epigastric pain.  Recheck of hemoglobin showed it was at 11 in January.  She had an iron infusion in February.  Was still on Omeprazole 20 mg daily with constant epigastric pain.  At that visit Omeprazole increased to 40 mg twice daily, told to avoid NSAIDs, recheck CBC and iron studies and scheduled for an EGD.    11/16/2021 EGD with normal esophagus and ulcerated mucosa in the gastric body and otherwise normal.    12/24/2021 CBC with normal hemoglobin at 13.9.  Iron studies normal.    02/01/2022 EGD with Z-line irregular 36 cm from the incisors, previously identified gastric ulcers had healed, erythematous mucosa in the antrum and a small hiatal hernia.  Pathology showed reflux.    Today, the patient tells me that she is doing well.  She does ask where we think her ulcers came from and what she can do to avoid them in the future.  In general she is not having any symptoms but is continued on her Omeprazole 40 mg twice daily.    Denies fever, chills, weight loss, change in bowel habits or abdominal pain.  Past Medical History:  Diagnosis Date   Anemia    not on meds at this time (08/04/2020)   PONV (postoperative nausea and vomiting)     Past Surgical History:  Procedure Laterality Date   BREAST BIOPSY Right    2013 (-)   CYSTECTOMY     uterine cyst   LAPAROSCOPIC TUBAL LIGATION     WISDOM TOOTH EXTRACTION      Current Outpatient  Medications  Medication Sig Dispense Refill   omeprazole (PRILOSEC) 40 MG capsule TAKE ONE CAPSULE BY MOUTH EVERY MORNING AND 1 CAPSULE AT BEDTIME 180 capsule 0   No current facility-administered medications for this visit.    Allergies as of 03/03/2022 - Review Complete 02/01/2022  Allergen Reaction Noted   Codeine  11/30/2011   Bacitra-neomycin-polymyxin-hc Rash 02/01/2022   Neomycin-bacitracin zn-polymyx Rash 11/30/2011    Family History  Problem Relation Age of Onset   Colon polyps Mother 45   Colon cancer Neg Hx    Esophageal cancer Neg Hx    Rectal cancer Neg Hx    Stomach cancer Neg Hx     Social History   Socioeconomic History   Marital status: Married    Spouse name: Not on file   Number of children: 2   Years of education: Not on file   Highest education level: Not on file  Occupational History   Not on file  Tobacco Use   Smoking status: Never   Smokeless tobacco: Never  Vaping Use   Vaping Use: Never used  Substance and Sexual Activity   Alcohol use: Never   Drug use: Never   Sexual activity: Not on file  Other Topics Concern   Not on file  Social History Narrative   Not on file   Social Determinants of Health   Financial Resource Strain: Not on file  Food Insecurity: Not on file  Transportation Needs: Not on file  Physical Activity: Not on file  Stress: Not on file  Social Connections: Not on file  Intimate Partner Violence: Not on file    Review of Systems:    Constitutional: No weight loss, fever or chills Cardiovascular: No chest pain Respiratory: No SOB  Gastrointestinal: See HPI and otherwise negative   Physical Exam:  Vital signs: BP 98/62 (BP Location: Left Arm, Patient Position: Sitting, Cuff Size: Normal)   Pulse 80   Ht 5' 3.5" (1.613 m) Comment: 5 3.5  Wt 144 lb 4 oz (65.4 kg)   LMP 02/26/2022   BMI 25.15 kg/m    Constitutional:   Pleasant Caucasian female appears to be in NAD, Well developed, Well nourished, alert and  cooperative Respiratory: Respirations even and unlabored. Lungs clear to auscultation bilaterally.   No wheezes, crackles, or rhonchi.  Cardiovascular: Normal S1, S2. No MRG. Regular rate and rhythm. No peripheral edema, cyanosis or pallor.  Gastrointestinal:  Soft, nondistended, nontender. No rebound or guarding. Normal bowel sounds. No appreciable masses or hepatomegaly. Rectal:  Not performed.  Psychiatric:  Demonstrates good judgement and reason without abnormal affect or behaviors.  RELEVANT LABS AND IMAGING: CBC    Component Value Date/Time   WBC 8.2 12/24/2021 1102   RBC 4.61 12/24/2021 1102   HGB 13.9 12/24/2021 1102   HCT 42.0 12/24/2021 1102   PLT 310.0 12/24/2021 1102   MCV 91.1 12/24/2021 1102   MCHC 33.2 12/24/2021 1102   RDW 13.5 12/24/2021 1102   LYMPHSABS 1.7 12/24/2021 1102   MONOABS 0.7 12/24/2021 1102   EOSABS 0.1 12/24/2021 1102   BASOSABS 0.1 12/24/2021 1102    Assessment: 1.  IDA: EGD with gastric ulcers thought the source, iron now back to normal after an infusion and oral iron supplementation, no further epigastric pain on Omeprazole 40 twice daily, follow-up EGD with no further signs of ulcers  Plan: 1.  Discussed that patient can decrease her Omeprazole to 40 mg daily.  We discussed going to every other day dosing for the first week and then just backing off to once daily. 2.  Refilled Omeprazole 40 mg daily, 30-60 minutes before breakfast #90 with 2 refills. 3.  Patient to follow in clinic with me in 6 months.  At that time if doing well we can discuss decreasing her Omeprazole more. 4.  Spent time answering her questions.  Most likely ulcerations came from Meloxicam use.  Discussed going on twice daily dosing of 40 mg if she ever has to go back on meloxicam or another NSAID/prednisone in the future.  Ellouise Newer, PA-C [facility] 03/03/2022, 8:26 AM  Cc: Haywood Pao, MD

## 2022-03-14 NOTE — Progress Notes (Signed)
Reviewed and agree with management plans. ? ?Ayari Liwanag L. Vanassa Penniman, MD, MPH  ?

## 2022-07-20 ENCOUNTER — Other Ambulatory Visit: Payer: Self-pay | Admitting: Obstetrics and Gynecology

## 2022-07-20 DIAGNOSIS — Z1231 Encounter for screening mammogram for malignant neoplasm of breast: Secondary | ICD-10-CM

## 2022-07-23 ENCOUNTER — Ambulatory Visit
Admission: RE | Admit: 2022-07-23 | Discharge: 2022-07-23 | Disposition: A | Discharge status: Home / Self Care | Payer: 59 | Source: Ambulatory Visit | Attending: Obstetrics and Gynecology | Admitting: Obstetrics and Gynecology

## 2022-07-23 DIAGNOSIS — Z1231 Encounter for screening mammogram for malignant neoplasm of breast: Secondary | ICD-10-CM

## 2023-02-19 ENCOUNTER — Encounter (HOSPITAL_BASED_OUTPATIENT_CLINIC_OR_DEPARTMENT_OTHER): Payer: Self-pay | Admitting: Emergency Medicine

## 2023-02-19 ENCOUNTER — Emergency Department (HOSPITAL_BASED_OUTPATIENT_CLINIC_OR_DEPARTMENT_OTHER)
Admission: EM | Admit: 2023-02-19 | Discharge: 2023-02-19 | Disposition: A | Discharge status: Home / Self Care | Payer: 59 | Attending: Emergency Medicine | Admitting: Emergency Medicine

## 2023-02-19 ENCOUNTER — Other Ambulatory Visit: Payer: Self-pay

## 2023-02-19 DIAGNOSIS — R21 Rash and other nonspecific skin eruption: Secondary | ICD-10-CM | POA: Diagnosis present

## 2023-02-19 DIAGNOSIS — T7840XA Allergy, unspecified, initial encounter: Secondary | ICD-10-CM

## 2023-02-19 MED ORDER — PREDNISONE 50 MG PO TABS: ORAL_TABLET | ORAL | 0 refills | Status: AC

## 2023-02-19 MED ORDER — METHYLPREDNISOLONE SODIUM SUCC 125 MG IJ SOLR
125.0000 mg | INTRAMUSCULAR | Status: AC
  Administered 2023-02-19: 125 mg via INTRAVENOUS
  Filled 2023-02-19: qty 2

## 2023-02-19 MED ORDER — FAMOTIDINE IN NACL 20-0.9 MG/50ML-% IV SOLN
20.0000 mg | Freq: Once | INTRAVENOUS | Status: AC
  Administered 2023-02-19: 20 mg via INTRAVENOUS
  Filled 2023-02-19: qty 50

## 2023-02-19 MED ORDER — DIPHENHYDRAMINE HCL 50 MG/ML IJ SOLN
50.0000 mg | Freq: Once | INTRAMUSCULAR | Status: AC
  Administered 2023-02-19: 50 mg via INTRAVENOUS
  Filled 2023-02-19: qty 1

## 2023-02-19 MED ORDER — LACTATED RINGERS IV SOLN: INTRAVENOUS | Status: DC

## 2023-02-19 NOTE — ED Triage Notes (Addendum)
Pt presents to ED POV. Pt c/o rash that itches all over body for ~1h. Pt reports that she was standing in a field when it started. Unknown allergen. Denies throat, lip, tongue swelling or tingling. Benadryl pta ~1430. Denies relief

## 2023-02-19 NOTE — Discharge Instructions (Addendum)
Use Benadryl as directed for the next 2 to 3 days.  Use Pepcid also as directed for the next 2 to 3 days.  Use your epinephrine pen if you have any trouble breathing

## 2023-02-19 NOTE — ED Notes (Signed)
 RN reviewed discharge instructions with pt. Pt verbalized understanding and had no further questions. VSS upon discharge.  

## 2023-02-19 NOTE — ED Provider Notes (Signed)
[place] EMERGENCY DEPARTMENT AT [facility] Provider Note   CSN: [id number] Arrival date & time: 02/19/23  1541     History  Chief Complaint  Patient presents with   Rash    Nancy Tate is a 52 y.o. female.  52 year old female presents with acute onset of hives which began today when she was standing in the field.  Says she got bit by something and then 30 minutes later she began to noted diffuse rash.  No shortness of breath.  No tongue or lip swelling.  No sensation of throat closing.  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  Does have an epinephrine pen but did not use it.  Took a dose of Benadryl prior to arrival with slight relief of her symptoms.       Home Medications Prior to Admission medications   Medication Sig Start Date End Date Taking? Authorizing Provider  omeprazole (PRILOSEC) 40 MG capsule Take 1 capsule (40 mg total) by mouth daily. 03/03/22   Unk Lightning, PA      Allergies    Codeine, Bacitra-neomycin-polymyxin-hc, and Neomycin-bacitracin zn-polymyx    Review of Systems   Review of Systems  All other systems reviewed and are negative.   Physical Exam Updated Vital Signs BP (!) 148/92 (BP Location: Right Arm)   Pulse 98   Temp 97.8 F (36.6 C) (Oral)   Resp 15   SpO2 100%  Physical Exam Vitals and nursing note reviewed.  Constitutional:      General: She is not in acute distress.    Appearance: Normal appearance. She is well-developed. She is not toxic-appearing.  HENT:     Head: Normocephalic and atraumatic.  Eyes:     General: Lids are normal.     Conjunctiva/sclera: Conjunctivae normal.     Pupils: Pupils are equal, round, and reactive to light.  Neck:     Thyroid: No thyroid mass.     Trachea: No tracheal deviation.  Cardiovascular:     Rate and Rhythm: Normal rate and regular rhythm.     Heart sounds: Normal heart sounds. No murmur heard.    No gallop.  Pulmonary:     Effort: Pulmonary  effort is normal. No respiratory distress.     Breath sounds: Normal breath sounds. No stridor. No decreased breath sounds, wheezing, rhonchi or rales.  Abdominal:     General: There is no distension.     Palpations: Abdomen is soft.     Tenderness: There is no abdominal tenderness. There is no rebound.  Musculoskeletal:        General: No tenderness. Normal range of motion.     Cervical back: Normal range of motion and neck supple.  Skin:    General: Skin is warm and dry.     Findings: Rash present. No abrasion. Rash is urticarial.  Neurological:     Mental Status: She is alert and oriented to person, place, and time. Mental status is at baseline.     GCS: GCS eye subscore is 4. GCS verbal subscore is 5. GCS motor subscore is 6.     Cranial Nerves: Cranial nerves are intact. No cranial nerve deficit.     Sensory: No sensory deficit.     Motor: Motor function is intact.  Psychiatric:        Attention and Perception: Attention normal.        Speech: Speech normal.        Behavior: Behavior normal.  ED Results / Procedures / Treatments   Labs (all labs ordered are listed, but only abnormal results are displayed) Labs Reviewed - No data to display  EKG None  Radiology No results found.  Procedures Procedures    Medications Ordered in ED Medications  lactated ringers infusion (has no administration in time range)  methylPREDNISolone sodium succinate (SOLU-MEDROL) 125 mg/2 mL injection 125 mg (has no administration in time range)  famotidine (PEPCID) IVPB 20 mg premix (has no administration in time range)  diphenhydrAMINE (BENADRYL) injection 50 mg (has no administration in time range)    ED Course/ Medical Decision Making/ A&P                                 Medical Decision Making Risk Prescription drug management.   Patient treated for allergic reaction with Benadryl, Pepcid, tramadol.  Monitored here and her hives are greatly improved.  No airway  involvement.  Patient already has an EpiPen.  Will place on 2 days of prednisone and give instructions on usage of Benadryl        Final Clinical Impression(s) / ED Diagnoses Final diagnoses:  None    Rx / DC Orders ED Discharge Orders     None         Lorre Nick, MD 02/19/23 [phone]

## 2023-03-13 ENCOUNTER — Other Ambulatory Visit: Payer: Self-pay | Admitting: Physician Assistant

## 2023-03-14 NOTE — Telephone Encounter (Signed)
Contacted pt & LVM to return call.

## 2023-06-14 ENCOUNTER — Other Ambulatory Visit: Payer: Self-pay | Admitting: Physician Assistant

## 2023-07-08 ENCOUNTER — Ambulatory Visit: Payer: 59 | Admitting: Physician Assistant

## 2023-07-14 ENCOUNTER — Other Ambulatory Visit: Payer: Self-pay | Admitting: Obstetrics and Gynecology

## 2023-07-14 DIAGNOSIS — Z1231 Encounter for screening mammogram for malignant neoplasm of breast: Secondary | ICD-10-CM

## 2023-07-28 ENCOUNTER — Ambulatory Visit
Admission: RE | Admit: 2023-07-28 | Discharge: 2023-07-28 | Disposition: A | Discharge status: Home / Self Care | Payer: 59 | Source: Ambulatory Visit | Attending: Obstetrics and Gynecology | Admitting: Obstetrics and Gynecology

## 2023-07-28 DIAGNOSIS — Z1231 Encounter for screening mammogram for malignant neoplasm of breast: Secondary | ICD-10-CM

## 2023-11-10 IMAGING — MG MM DIGITAL SCREENING BILAT W/ TOMO AND CAD
8 series · 8 of 24 positions shown · non-contrast
Comparison: Previous exam(s).

CLINICAL DATA: Screening.

EXAM:
DIGITAL SCREENING BILATERAL MAMMOGRAM WITH TOMOSYNTHESIS AND CAD
TECHNIQUE: Bilateral screening digital craniocaudal and mediolateral oblique
mammograms were obtained. Bilateral screening digital breast
tomosynthesis was performed. The images were evaluated with
computer-aided detection.

[R CC synth-2D]
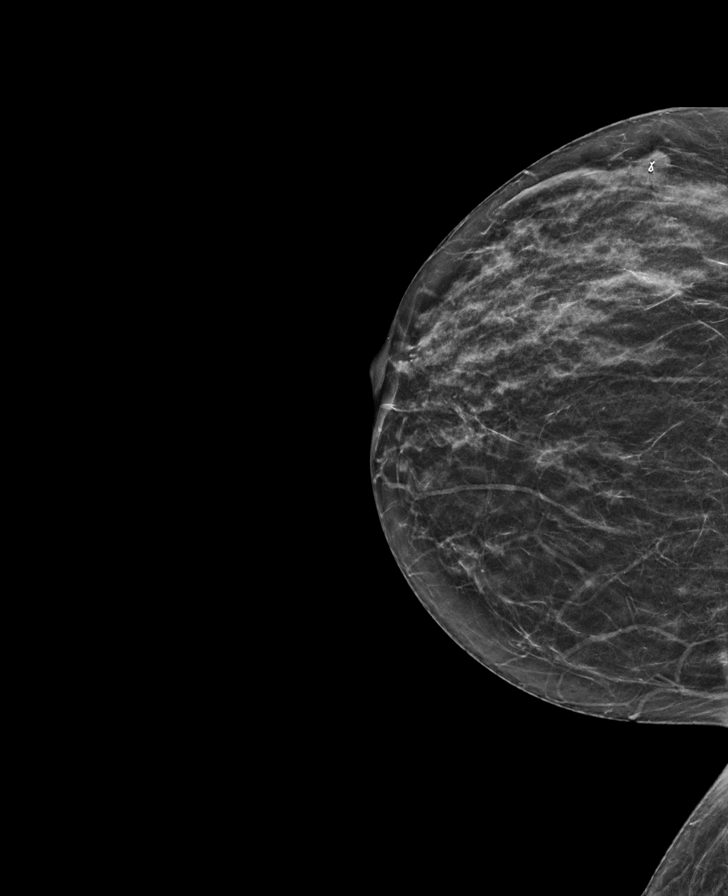

[L CC synth-2D]
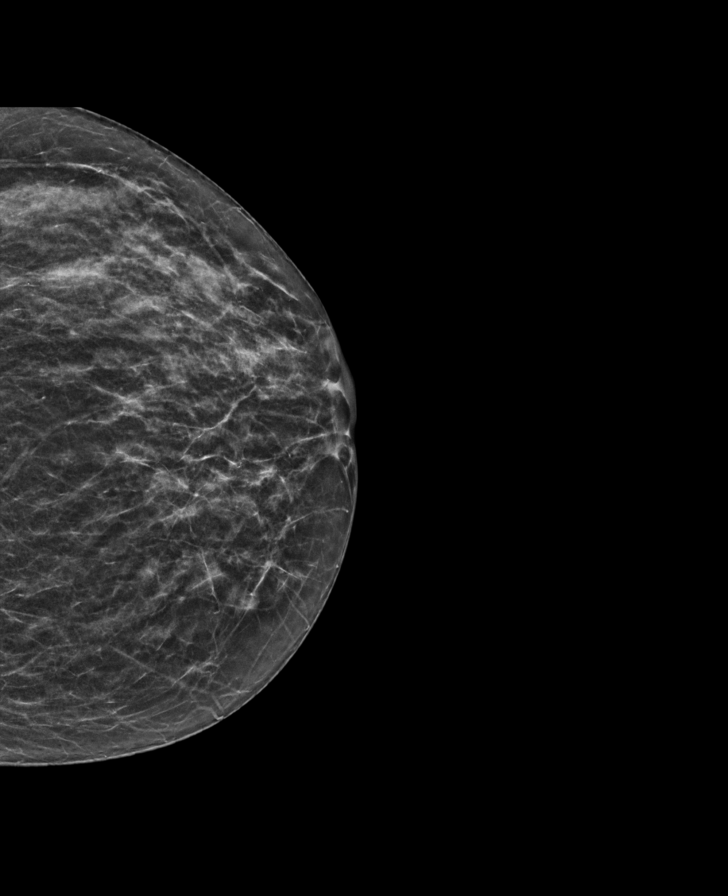

[R MLO synth-2D]
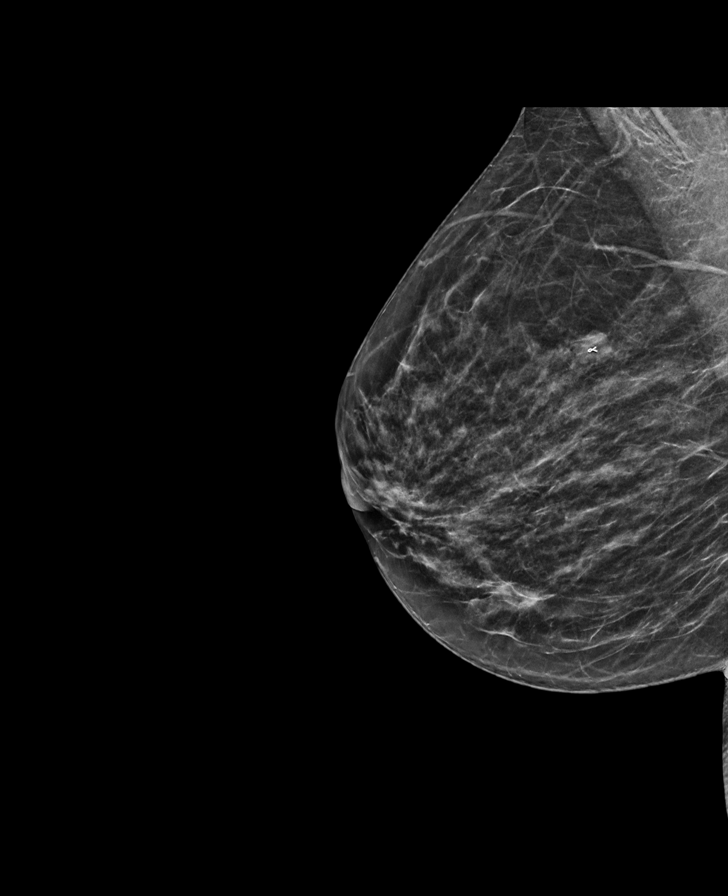

[L MLO synth-2D]
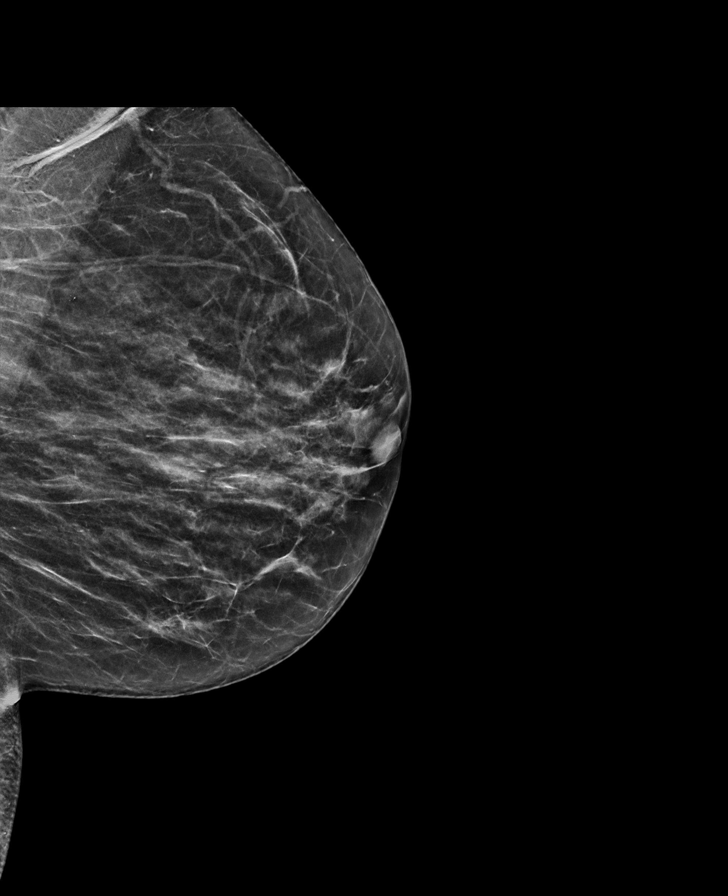

[R MLO tomo · tomo slice 28/55.0]
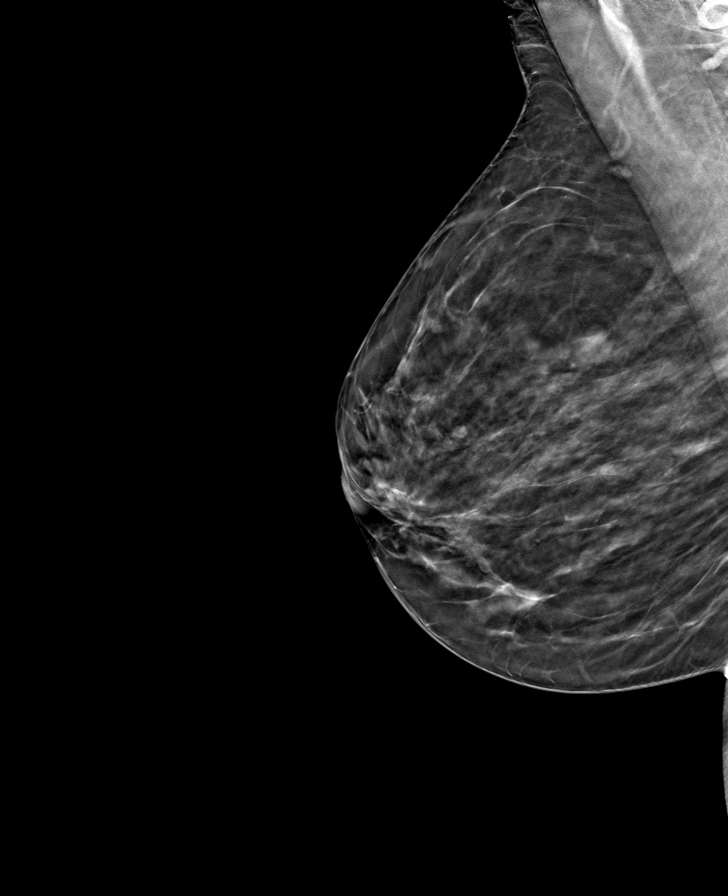

[L CC tomo · tomo slice 25/50.0]
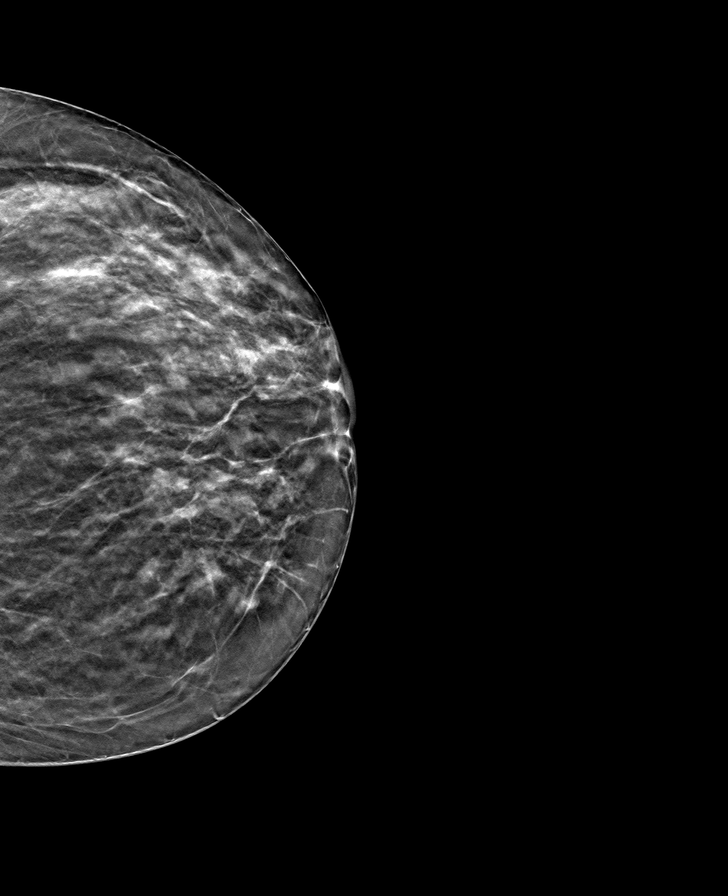

[R CC tomo · tomo slice 25/49.0]
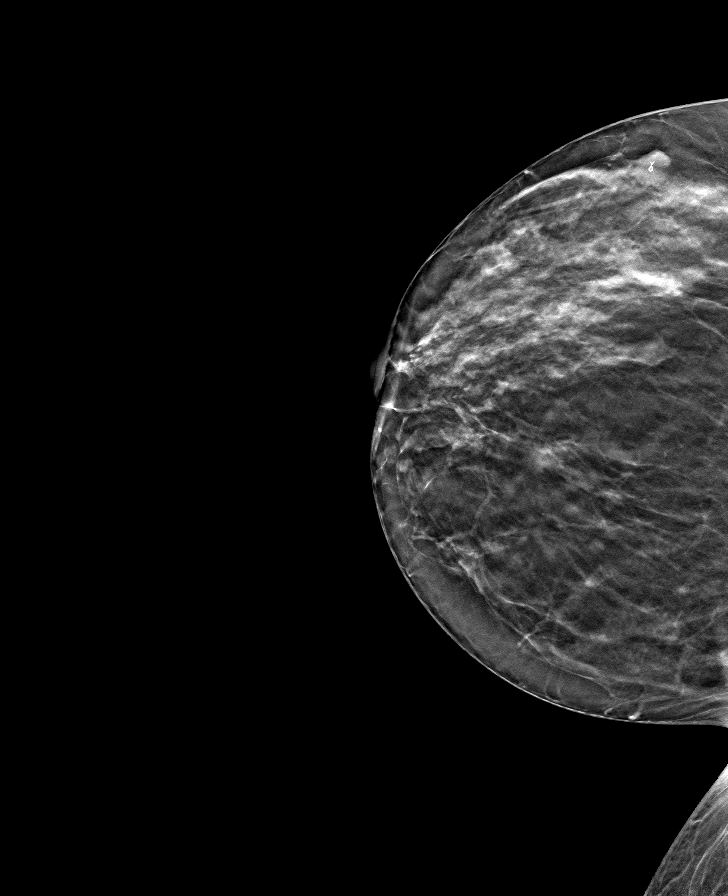

[L MLO tomo · tomo slice 29/58.0]
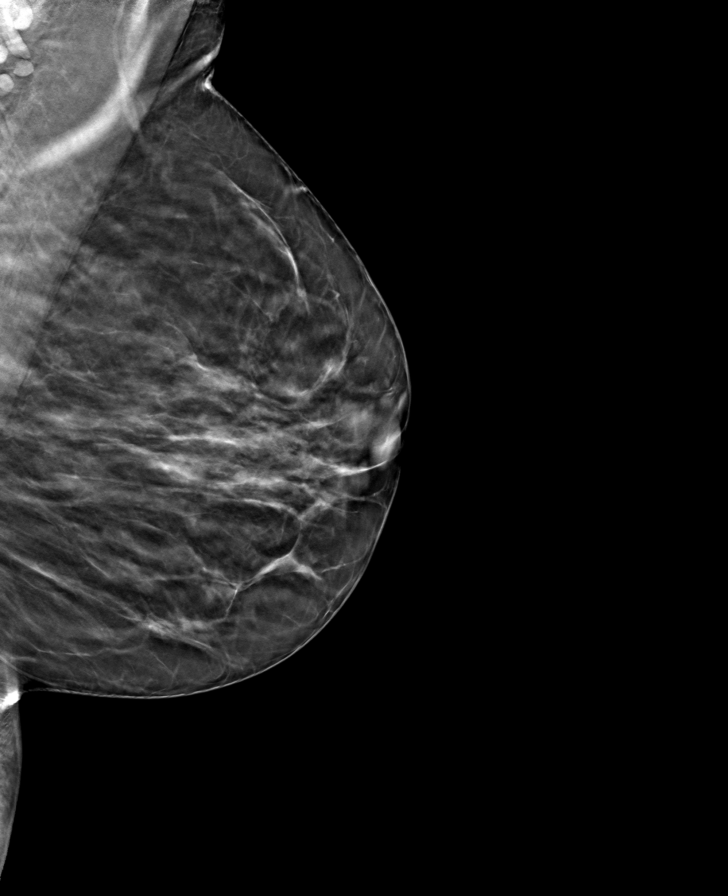

[8 of 24 positions shown; findings below may reference images not displayed]

ACR Breast Density Category c: The breast tissue is heterogeneously
dense, which may obscure small masses.
FINDINGS: There are no findings suspicious for malignancy.
IMPRESSION: No mammographic evidence of malignancy. A result letter of this
screening mammogram will be mailed directly to the patient.

RECOMMENDATION:
Screening mammogram in one year. (Code:Q3-W-BC3)

BI-RADS CATEGORY  1: Negative.

## 2024-07-05 ENCOUNTER — Other Ambulatory Visit: Payer: Self-pay | Admitting: Obstetrics and Gynecology

## 2024-07-05 DIAGNOSIS — Z1231 Encounter for screening mammogram for malignant neoplasm of breast: Secondary | ICD-10-CM

## 2024-07-31 ENCOUNTER — Ambulatory Visit
Admission: RE | Admit: 2024-07-31 | Discharge: 2024-07-31 | Disposition: A | Source: Ambulatory Visit | Attending: Obstetrics and Gynecology | Admitting: Obstetrics and Gynecology

## 2024-07-31 DIAGNOSIS — Z1231 Encounter for screening mammogram for malignant neoplasm of breast: Secondary | ICD-10-CM
